# Patient Record
Sex: Male | Born: 2000 | Race: Black or African American | Hispanic: No | Marital: Single | Smoking: Never smoker
Health system: Southern US, Community
[De-identification: ages and names within clinical notes are randomized; demographics above are authoritative.]

## PROBLEM LIST (undated history)

## (undated) HISTORY — PX: TYMPANOSTOMY TUBE PLACEMENT: SHX32

## (undated) HISTORY — PX: APPENDECTOMY: SHX54

---
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
## 2022-11-07 ENCOUNTER — Other Ambulatory Visit: Payer: Self-pay

## 2022-11-07 ENCOUNTER — Emergency Department (HOSPITAL_COMMUNITY)
Admission: EM | Admit: 2022-11-07 | Discharge: 2022-11-07 | Disposition: A | Discharge status: Home / Self Care | Payer: Medicaid Other | Attending: Emergency Medicine | Admitting: Emergency Medicine

## 2022-11-07 ENCOUNTER — Encounter (HOSPITAL_COMMUNITY): Payer: Self-pay

## 2022-11-07 DIAGNOSIS — S01311A Laceration without foreign body of right ear, initial encounter: Secondary | ICD-10-CM | POA: Diagnosis present

## 2022-11-07 MED ORDER — IBUPROFEN 600 MG PO TABS: 600.0000 mg | ORAL_TABLET | Freq: Four times a day (QID) | ORAL | 0 refills | Status: DC | PRN

## 2022-11-07 MED ORDER — ACETAMINOPHEN 325 MG PO TABS: 650.0000 mg | ORAL_TABLET | Freq: Four times a day (QID) | ORAL | 0 refills | Status: DC | PRN

## 2022-11-07 MED ORDER — CYCLOBENZAPRINE HCL 10 MG PO TABS: 10.0000 mg | ORAL_TABLET | Freq: Two times a day (BID) | ORAL | 0 refills | Status: DC | PRN
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
## 2023-01-18 ENCOUNTER — Emergency Department (HOSPITAL_COMMUNITY)
Admission: EM | Admit: 2023-01-18 | Discharge: 2023-01-18 | Disposition: A | Discharge status: Home / Self Care | Payer: Medicaid Other | Attending: Emergency Medicine | Admitting: Emergency Medicine

## 2023-01-18 DIAGNOSIS — S0993XA Unspecified injury of face, initial encounter: Secondary | ICD-10-CM

## 2023-01-18 DIAGNOSIS — Y92410 Unspecified street and highway as the place of occurrence of the external cause: Secondary | ICD-10-CM | POA: Insufficient documentation

## 2023-01-18 MED ORDER — IBUPROFEN 600 MG PO TABS: 600.0000 mg | ORAL_TABLET | Freq: Four times a day (QID) | ORAL | 0 refills | Status: DC
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
## 2023-01-18 NOTE — Discharge Instructions (Signed)
Apply ice to the affected area for the next 24 hours.  Thereafter he can alternate between ice and heat therapy. Take Motrin every 6 hours for the next 3 days. Return to the ER if you start having difficulty with vision, increasing swelling of the face.
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
## 2023-08-05 ENCOUNTER — Encounter (HOSPITAL_COMMUNITY): Payer: Self-pay

## 2023-08-05 ENCOUNTER — Other Ambulatory Visit: Payer: Self-pay

## 2023-08-05 ENCOUNTER — Emergency Department (HOSPITAL_COMMUNITY)
Admission: EM | Admit: 2023-08-05 | Discharge: 2023-08-05 | Discharge status: Against Medical Advice | Attending: Emergency Medicine | Admitting: Emergency Medicine

## 2023-08-05 DIAGNOSIS — R109 Unspecified abdominal pain: Secondary | ICD-10-CM | POA: Diagnosis not present

## 2023-08-05 DIAGNOSIS — R42 Dizziness and giddiness: Secondary | ICD-10-CM | POA: Insufficient documentation

## 2023-08-05 DIAGNOSIS — Z5321 Procedure and treatment not carried out due to patient leaving prior to being seen by health care provider: Secondary | ICD-10-CM | POA: Diagnosis not present

## 2023-08-05 DIAGNOSIS — R112 Nausea with vomiting, unspecified: Secondary | ICD-10-CM | POA: Diagnosis present

## 2023-08-05 DIAGNOSIS — R519 Headache, unspecified: Secondary | ICD-10-CM | POA: Insufficient documentation

## 2023-08-05 LAB — URINALYSIS, ROUTINE W REFLEX MICROSCOPIC
Bilirubin Urine: NEGATIVE
Glucose, UA: NEGATIVE mg/dL
Hgb urine dipstick: NEGATIVE
Ketones, ur: NEGATIVE mg/dL
Leukocytes,Ua: NEGATIVE
Nitrite: NEGATIVE
Protein, ur: NEGATIVE mg/dL
Specific Gravity, Urine: 1.026 (ref 1.005–1.030)
pH: 6 (ref 5.0–8.0)

## 2023-08-05 LAB — CBC
HCT: 43.8 % (ref 39.0–52.0)
Hemoglobin: 15 g/dL (ref 13.0–17.0)
MCH: 32.4 pg (ref 26.0–34.0)
MCHC: 34.2 g/dL (ref 30.0–36.0)
MCV: 94.6 fL (ref 80.0–100.0)
Platelets: 229 K/uL (ref 150–400)
RBC: 4.63 MIL/uL (ref 4.22–5.81)
RDW: 12.6 % (ref 11.5–15.5)
WBC: 7.5 K/uL (ref 4.0–10.5)
nRBC: 0 % (ref 0.0–0.2)

## 2023-08-05 LAB — COMPREHENSIVE METABOLIC PANEL WITH GFR
ALT: 27 U/L (ref 0–44)
AST: 28 U/L (ref 15–41)
Albumin: 4.1 g/dL (ref 3.5–5.0)
Alkaline Phosphatase: 49 U/L (ref 38–126)
Anion gap: 12 (ref 5–15)
BUN: 15 mg/dL (ref 6–20)
CO2: 22 mmol/L (ref 22–32)
Calcium: 9 mg/dL (ref 8.9–10.3)
Chloride: 102 mmol/L (ref 98–111)
Creatinine, Ser: 1.05 mg/dL (ref 0.61–1.24)
GFR, Estimated: >60 mL/min (ref >60)
Glucose, Bld: 98 mg/dL (ref 70–99)
Potassium: 3.6 mmol/L (ref 3.5–5.1)
Sodium: 136 mmol/L (ref 135–145)
Total Bilirubin: 1.1 mg/dL (ref 0.0–1.2)
Total Protein: 7.2 g/dL (ref 6.5–8.1)

## 2023-08-05 LAB — LIPASE, BLOOD: Lipase: 25 U/L (ref 11–51)

## 2023-08-05 MED ORDER — ONDANSETRON 4 MG PO TBDP
4.0000 mg | ORAL_TABLET | Freq: Once | ORAL | Status: AC | PRN
  Administered 2023-08-05: 4 mg via ORAL
  Filled 2023-08-05: qty 1

## 2023-08-05 NOTE — ED Notes (Signed)
 Pt did not want to be seen stated he feels better ride is here to get him

## 2023-08-05 NOTE — ED Triage Notes (Signed)
 Pt states he woke up a few hours ago w/a headache, feeling lightheaded, nausea, vomiting, and abdominal pain.

## 2023-09-25 ENCOUNTER — Ambulatory Visit (INDEPENDENT_AMBULATORY_CARE_PROVIDER_SITE_OTHER): Admitting: Urology

## 2023-09-25 VITALS — BP 122/81 | HR 79 | Wt 160.0 lb

## 2023-09-25 DIAGNOSIS — R3912 Poor urinary stream: Secondary | ICD-10-CM | POA: Diagnosis not present

## 2023-09-25 LAB — BLADDER SCAN AMB NON-IMAGING

## 2023-09-25 MED ORDER — CELECOXIB 200 MG PO CAPS
200.0000 mg | ORAL_CAPSULE | Freq: Two times a day (BID) | ORAL | 0 refills | Status: AC
Start: 2023-09-25 — End: ?

## 2023-09-25 NOTE — Progress Notes (Signed)
   09/25/23 8:49 AM   Tristan Stevenson 21-15-2002 [id number]  CC: Weak urinary stream, sensation of incomplete emptying  HPI: Healthy 23 year old male who reports at least 5 years of urinary symptoms with weak stream and a sensation of incomplete emptying.  He feels like things may be better when he drinks primarily water during the day.  He denies any UTIs, dysuria, or gross hematuria, no pelvic pain.  No prior catheterizations or urologic procedures. PVR today is normal at 6ml  Recent urinalysis benign, STD testing normal, PSA normal at 0.92, hemoglobin A1c normal at 5.5, normal renal function eGFR greater than 60.  Surgical History: Past Surgical History:  Procedure Laterality Date   APPENDECTOMY     KNEE ARTHROSCOPY WITH LATERAL MENISECTOMY Left 03/26/2017   Procedure: KNEE ARTHROSCOPY WITH PARTIAL LATERAL MENISECTOMY;  Surgeon: Shari Sieving, MD;  Location: [facility];  Service: Orthopedics;  Laterality: Left;   LAPAROSCOPIC APPENDECTOMY N/A 09/12/2021   Procedure: APPENDECTOMY LAPAROSCOPIC;  Surgeon: Ebbie Cough, MD;  Location: [facility] OR;  Service: General;  Laterality: N/A;   TYMPANOSTOMY TUBE PLACEMENT Left    Social History:  reports that he has never smoked. He has never used smokeless tobacco. He reports that he does not drink alcohol and does not use drugs.  Physical Exam: BP 122/81 (BP Location: Left Arm, Patient Position: Sitting, Cuff Size: Normal)   Pulse 79   Wt 160 lb (72.6 kg)   SpO2 99%   BMI 20.54 kg/m    Constitutional:  Alert and oriented, No acute distress. Cardiovascular: No clubbing, cyanosis, or edema. Respiratory: Normal respiratory effort, no increased work of breathing. GI: Abdomen is soft, nontender, nondistended, no abdominal masses GU: Circumcised phallus, patent meatus, no lesions  Laboratory Data: Reviewed, see HPI  Assessment & Plan:   23 year old healthy male with at least 5 years of urinary symptoms with weak stream and  sensation of incomplete emptying.  These do seem to be exacerbated by bladder irritants and improved with water intake.  We reviewed possible etiologies including pelvic floor dysfunction or urethral stricture.  He would like to avoid cystoscopy if possible.    Trial of pelvic floor stretching exercises and Celebrex  RTC for cystoscopy 6 weeks, can cancel if symptoms have resolved  Redell Burnet, MD 09/25/2023  [facility] [address], [address] [place], [place] [address] [phone]

## 2023-09-25 NOTE — Patient Instructions (Signed)
 Pelvic Floor Dysfunction, Male     Pelvic floor dysfunction (PFD) is a condition that results when the group of muscles and connective tissues that support the organs in the pelvis (pelvic floor muscles) do not work well. These muscles and their connections form a sling that supports the colon and bladder. In men, these muscles also support the prostate gland. PFD causes pelvic floor muscles to be too weak, too tight, or both. In PFD, muscle movements are not coordinated. This may cause bowel or bladder problems. It may also cause pain. What are the causes? This condition may be caused by an injury to the pelvic area or by a weakening of pelvic muscles. In many cases, the exact cause is not known. What increases the risk? The following factors may make you more likely to develop PFD: Having chronic bladder tissue inflammation (interstitial cystitis). Being an older person. Being overweight. History of radiation treatment for cancer in the pelvic region. Previous pelvic surgery, such as removal of the prostate gland (prostatectomy). What are the signs or symptoms? Symptoms of this condition vary and may include: Bladder symptoms, such as: Trouble starting urination and emptying the bladder. Frequent urinary tract infections. Leaking urine when coughing, laughing, or exercising (stress incontinence). Having to pass urine urgently or frequently. Pain when passing urine. Bowel symptoms, such as: Constipation. Urgent or frequent bowel movements. Incomplete bowel movements. Painful bowel movements. Leaking stool or gas. Unexplained genital or rectal pain. Genital or rectal muscle spasms. Low back pain. Sexual dysfunction, such as erectile dysfunction, premature ejaculation, or pain during or after sexual activity. How is this diagnosed? This condition is diagnosed based on: Your symptoms and medical history. A physical exam. During the exam, your health care provider may check  your pelvic muscles for tightness, spasm, pain, or weakness. This may include a rectal exam. In some cases, you may have diagnostic tests, such as: Electrical muscle function tests. Urine flow testing. X-ray tests of bowel function. Ultrasound of the pelvic organs. How is this treated? Treatment for this condition depends on your symptoms. Treatment options include: Physical therapy. This may include Kegel exercises to help relax or strengthen the pelvic floor muscles. Biofeedback. This type of therapy provides feedback on how tight your pelvic floor muscles are so that you can learn to control them. Massage therapy. A treatment that involves electrical stimulation of the pelvic floor muscles to help control pain (transcutaneous electrical nerve stimulation, or TENS). Sound wave therapy (ultrasound) to reduce muscle spasms. Medicines, such as: Muscle relaxants. Bladder control medicines. Surgery to reconstruct or support pelvic floor muscles may be an option if other treatments do not help. Follow these instructions at home: Activity Do your usual activities as told by your health care provider. Ask your health care provider if you should modify any activities. Do pelvic floor strengthening or relaxing exercises at home as told by your physical therapist. Lifestyle Maintain a healthy weight. Eat foods that are high in fiber, such as beans, whole grains, and fresh fruits and vegetables. Limit foods that are high in fat and processed sugars, such as fried or sweet foods. Manage stress with relaxation techniques such as yoga or meditation. General instructions If you have problems with leakage: Use absorbable pads or wear padded underwear. Wash your genital and anal area frequently with mild soap. Keep your genital and anal area as clean and dry as possible. Ask your health care provider if you should try a barrier cream to prevent skin irritation. Take  warm baths to relieve pelvic  muscle tension or spasms. Take over-the-counter and prescription medicines only as told by your health care provider. Keep all follow-up visits. How is this prevented? The cause of PFD is not always known, but there are a few things you can do to reduce the risk of developing this condition, including: Staying at a healthy weight. Getting regular exercise. Managing stress. Contact a health care provider if: Your symptoms are not improving with home care. You have signs or symptoms of PFD that get worse. You develop new signs or symptoms. You have signs of a urinary tract infection, such as: Fever. Chills. Increased urinary frequency. A burning feeling when urinating. You have not had a bowel movement in 3 days (constipation). Summary Pelvic floor dysfunction results when the muscles and connective tissues in your pelvic floor do not work well. These muscles and their connections form a sling that supports your colon and bladder. In men, these muscles also support the prostate gland. PFD may be caused by an injury to the pelvic area or by a weakening of pelvic muscles. PFD causes pelvic floor muscles to be too weak, too tight, or a combination of both. Symptoms may vary from person to person. In most cases, PFD can be treated with physical therapies and medicines. Surgery may be an option if other treatments do not help.  Cystoscopy Cystoscopy is a procedure that is used to help diagnose and sometimes treat conditions that affect the lower urinary tract. The lower urinary tract includes the bladder and the urethra. The urethra is the tube that drains urine from the bladder. Cystoscopy is done using a thin, tube-shaped instrument with a light and camera at the end (cystoscope). The cystoscope may be hard or flexible, depending on the goal of the procedure. The cystoscope is inserted through the urethra, into the bladder. Cystoscopy may be recommended if you have: Urinary tract infections  that keep coming back. Blood in the urine (hematuria). An inability to control when you urinate (urinary incontinence) or an overactive bladder. Unusual cells found in a urine sample. A blockage in the urethra, such as a urinary stone. Painful urination. An abnormality in the bladder found during an intravenous pyelogram (IVP) or CT scan. What are the risks? Generally, this is a safe procedure. However, problems may occur, including: Infection. Bleeding.  What happens during the procedure?  You will be given one or more of the following: A medicine to numb the area (local anesthetic). The area around the opening of your urethra will be cleaned. The cystoscope will be passed through your urethra into your bladder. Germ-free (sterile) fluid will flow through the cystoscope to fill your bladder. The fluid will stretch your bladder so that your health care provider can clearly examine your bladder walls. Your doctor will look at the urethra and bladder. The cystoscope will be removed The procedure may vary among health care providers  What can I expect after the procedure? After the procedure, it is common to have: Some soreness or pain in your urethra. Urinary symptoms. These include: Mild pain or burning when you urinate. Pain should stop within a few minutes after you urinate. This may last for up to a few days after the procedure. A small amount of blood in your urine for several days. Feeling like you need to urinate but producing only a small amount of urine. Follow these instructions at home: General instructions Return to your normal activities as told by your health care provider.  Drink plenty of fluids after the procedure. Keep all follow-up visits as told by your health care provider. This is important. Contact a health care provider if you: Have pain that gets worse or does not get better with medicine, especially pain when you urinate lasting longer than 72 hours after  the procedure. Have trouble urinating. Get help right away if you: Have blood clots in your urine. Have a fever or chills. Are unable to urinate. Summary Cystoscopy is a procedure that is used to help diagnose and sometimes treat conditions that affect the lower urinary tract. Cystoscopy is done using a thin, tube-shaped instrument with a light and camera at the end. After the procedure, it is common to have some soreness or pain in your urethra. It is normal to have blood in your urine after the procedure.  If you were prescribed an antibiotic medicine, take it as told by your health care provider.  This information is not intended to replace advice given to you by your health care provider. Make sure you discuss any questions you have with your health care provider. Document Revised: 01/06/2018 Document Reviewed: 01/06/2018 Elsevier Patient Education  2020 [employer].

## 2023-11-06 ENCOUNTER — Other Ambulatory Visit: Admitting: Urology

## 2024-01-14 ENCOUNTER — Emergency Department (HOSPITAL_COMMUNITY)

## 2024-01-14 ENCOUNTER — Other Ambulatory Visit: Payer: Self-pay

## 2024-01-14 ENCOUNTER — Emergency Department (HOSPITAL_COMMUNITY)
Admission: EM | Admit: 2024-01-14 | Discharge: 2024-01-14 | Disposition: A | Discharge status: Home / Self Care | Attending: Emergency Medicine | Admitting: Emergency Medicine

## 2024-01-14 ENCOUNTER — Encounter (HOSPITAL_COMMUNITY): Payer: Self-pay

## 2024-01-14 DIAGNOSIS — R509 Fever, unspecified: Secondary | ICD-10-CM | POA: Diagnosis present

## 2024-01-14 DIAGNOSIS — E860 Dehydration: Secondary | ICD-10-CM | POA: Diagnosis not present

## 2024-01-14 DIAGNOSIS — M545 Low back pain, unspecified: Secondary | ICD-10-CM | POA: Diagnosis not present

## 2024-01-14 DIAGNOSIS — R1110 Vomiting, unspecified: Secondary | ICD-10-CM

## 2024-01-14 DIAGNOSIS — J101 Influenza due to other identified influenza virus with other respiratory manifestations: Secondary | ICD-10-CM | POA: Diagnosis not present

## 2024-01-14 LAB — COMPREHENSIVE METABOLIC PANEL WITH GFR
ALT: 40 U/L (ref 0–44)
AST: 48 U/L — ABNORMAL HIGH (ref 15–41)
Albumin: 4.5 g/dL (ref 3.5–5.0)
Alkaline Phosphatase: 70 U/L (ref 38–126)
Anion gap: 12 (ref 5–15)
BUN: 13 mg/dL (ref 6–20)
CO2: 23 mmol/L (ref 22–32)
Calcium: 9.3 mg/dL (ref 8.9–10.3)
Chloride: 106 mmol/L (ref 98–111)
Creatinine, Ser: 1.04 mg/dL (ref 0.61–1.24)
GFR, Estimated: >60 mL/min (ref >60)
Glucose, Bld: 104 mg/dL — ABNORMAL HIGH (ref 70–99)
Potassium: 4 mmol/L (ref 3.5–5.1)
Sodium: 141 mmol/L (ref 135–145)
Total Bilirubin: 1.3 mg/dL — ABNORMAL HIGH (ref 0.0–1.2)
Total Protein: 7.1 g/dL (ref 6.5–8.1)

## 2024-01-14 LAB — CBC WITH DIFFERENTIAL/PLATELET
Abs Immature Granulocytes: 0.01 K/uL (ref 0.00–0.07)
Basophils Absolute: 0 K/uL (ref 0.0–0.1)
Basophils Relative: 0 %
Eosinophils Absolute: 0 K/uL (ref 0.0–0.5)
Eosinophils Relative: 0 %
HCT: 39.2 % (ref 39.0–52.0)
Hemoglobin: 13.4 g/dL (ref 13.0–17.0)
Immature Granulocytes: 0 %
Lymphocytes Relative: 5 %
Lymphs Abs: 0.4 K/uL — ABNORMAL LOW (ref 0.7–4.0)
MCH: 32.1 pg (ref 26.0–34.0)
MCHC: 34.2 g/dL (ref 30.0–36.0)
MCV: 93.8 fL (ref 80.0–100.0)
Monocytes Absolute: 0.7 K/uL (ref 0.1–1.0)
Monocytes Relative: 8 %
Neutro Abs: 7 K/uL (ref 1.7–7.7)
Neutrophils Relative %: 87 %
Platelets: 195 K/uL (ref 150–400)
RBC: 4.18 MIL/uL — ABNORMAL LOW (ref 4.22–5.81)
RDW: 12.9 % (ref 11.5–15.5)
WBC: 8.2 K/uL (ref 4.0–10.5)
nRBC: 0 % (ref 0.0–0.2)

## 2024-01-14 LAB — URINALYSIS, ROUTINE W REFLEX MICROSCOPIC
Bilirubin Urine: NEGATIVE
Glucose, UA: NEGATIVE mg/dL
Hgb urine dipstick: NEGATIVE
Ketones, ur: NEGATIVE mg/dL
Leukocytes,Ua: NEGATIVE
Nitrite: NEGATIVE
Protein, ur: NEGATIVE mg/dL
Specific Gravity, Urine: 1.025 (ref 1.005–1.030)
pH: 6.5 (ref 5.0–8.0)

## 2024-01-14 LAB — GROUP A STREP BY PCR: Group A Strep by PCR: NOT DETECTED

## 2024-01-14 LAB — RESP PANEL BY RT-PCR (RSV, FLU A&B, COVID)  RVPGX2
Influenza A by PCR: POSITIVE — AB
Influenza B by PCR: NEGATIVE
Resp Syncytial Virus by PCR: NEGATIVE
SARS Coronavirus 2 by RT PCR: NEGATIVE

## 2024-01-14 LAB — LIPASE, BLOOD: Lipase: 13 U/L (ref 11–51)

## 2024-01-14 MED ORDER — ONDANSETRON 4 MG PO TBDP: ORAL_TABLET | ORAL | 0 refills | Status: AC

## 2024-01-14 MED ORDER — ONDANSETRON 4 MG PO TBDP
4.0000 mg | ORAL_TABLET | Freq: Once | ORAL | Status: AC
  Administered 2024-01-14: 10:00:00 4 mg via ORAL
  Filled 2024-01-14: qty 1

## 2024-01-14 NOTE — ED Triage Notes (Signed)
 Patient complains of sore throat with nausea and vomiting x 1 day, denies fever. NAD

## 2024-01-14 NOTE — ED Notes (Signed)
 Discharge papers discussed with pt caregiver. Discussed s/sx to return, follow up with PCP, medications given/next dose due. Caregiver verbalized understanding.  ?

## 2024-01-14 NOTE — ED Provider Notes (Signed)
EMERGENCY DEPARTMENT AT [facility] Provider Note   CSN: [id number] Arrival date & time: 01/14/24  9143     Patient presents with: Nausea (Pt complaining of nausea, body aches, and chills x 1 day)   Tristan Stevenson is a 23 y.o. male.   Patient presents with sore throat, cough congestion breathing difficulty nausea and vomiting for 2 days.  Patient had subjective fevers.  No chest pain abdominal pain or passing out.  Patient has lower back pain.  No history of kidney stones.  No recent sick contact exposure or travel.  The  The history is provided by the patient.       Prior to Admission medications  Medication Sig Start Date End Date Taking? Authorizing Provider  ondansetron  (ZOFRAN -ODT) 4 MG disintegrating tablet 4mg  ODT q6 hours prn nausea/vomit 01/14/24  Yes Nicholi Ghuman, MD  acetaminophen  (TYLENOL ) 325 MG tablet Take 2 tablets (650 mg total) by mouth every 6 (six) hours as needed. 11/07/22   Elnor Jayson LABOR, DO  bismuth subsalicylate (PEPTO BISMOL) 262 MG chewable tablet Chew 786 mg by mouth daily as needed for diarrhea or loose stools.    [provider]  celecoxib  (CELEBREX ) 200 MG capsule Take 1 capsule (200 mg total) by mouth 2 (two) times daily. 09/25/23   Francisca Redell BROCKS, MD  cyclobenzaprine  (FLEXERIL ) 10 MG tablet Take 1 tablet (10 mg total) by mouth 2 (two) times daily as needed for muscle spasms. 11/07/22   Elnor Jayson LABOR, DO  ibuprofen  (ADVIL ) 600 MG tablet Take 1 tablet (600 mg total) by mouth 4 (four) times daily. 01/18/23   Charlyn Sora, MD  traMADol  (ULTRAM ) 50 MG tablet Take 2 tablets (100 mg total) by mouth every 6 (six) hours as needed. 09/12/21   Ebbie Cough, MD    Allergies: Patient has no known allergies.    Review of Systems  Constitutional:  Positive for appetite change and fever. Negative for chills.  HENT:  Positive for congestion.   Eyes:  Negative for visual disturbance.  Respiratory:  Positive for cough and  shortness of breath.   Cardiovascular:  Negative for chest pain.  Gastrointestinal:  Positive for nausea. Negative for abdominal pain and vomiting.  Genitourinary:  Negative for dysuria and flank pain.  Musculoskeletal:  Positive for back pain. Negative for neck pain and neck stiffness.  Skin:  Negative for rash.  Neurological:  Negative for light-headedness and headaches.    Updated Vital Signs BP (!) 125/59 (BP Location: Right Arm)   Pulse 91   Temp (!) 97.4 F (36.3 C) (Oral)   Resp 18   Ht 6' 2 (1.88 m)   Wt 72 kg   SpO2 100%   BMI 20.38 kg/m   Physical Exam Vitals and nursing note reviewed.  Constitutional:      General: He is not in acute distress.    Appearance: He is well-developed.  HENT:     Head: Normocephalic and atraumatic.     Mouth/Throat:     Mouth: Mucous membranes are moist.  Eyes:     General:        Right eye: No discharge.        Left eye: No discharge.     Conjunctiva/sclera: Conjunctivae normal.  Neck:     Trachea: No tracheal deviation.  Cardiovascular:     Rate and Rhythm: Normal rate and regular rhythm.     Heart sounds: No murmur heard. Pulmonary:     Effort: Pulmonary effort  is normal.     Breath sounds: Normal breath sounds.  Abdominal:     General: There is no distension.     Palpations: Abdomen is soft.     Tenderness: There is no abdominal tenderness. There is no guarding.  Musculoskeletal:     Cervical back: Normal range of motion and neck supple. No rigidity.  Skin:    General: Skin is warm.     Capillary Refill: Capillary refill takes less than 2 seconds.     Findings: No rash.  Neurological:     General: No focal deficit present.     Mental Status: He is alert.     Cranial Nerves: No cranial nerve deficit.  Psychiatric:        Mood and Affect: Mood normal.     (all labs ordered are listed, but only abnormal results are displayed) Labs Reviewed  RESP PANEL BY RT-PCR (RSV, FLU A&B, COVID)  RVPGX2 - Abnormal; Notable  for the following components:      Result Value   Influenza A by PCR POSITIVE (*)    All other components within normal limits  CBC WITH DIFFERENTIAL/PLATELET - Abnormal; Notable for the following components:   RBC 4.18 (*)    Lymphs Abs 0.4 (*)    All other components within normal limits  COMPREHENSIVE METABOLIC PANEL WITH GFR - Abnormal; Notable for the following components:   Glucose, Bld 104 (*)    AST 48 (*)    Total Bilirubin 1.3 (*)    All other components within normal limits  GROUP A STREP BY PCR  LIPASE, BLOOD  URINALYSIS, ROUTINE W REFLEX MICROSCOPIC    EKG: None  Radiology: DG Chest 2 View Result Date: 01/14/2024 EXAM: 2 VIEW(S) XRAY OF THE CHEST 01/14/2024 10:00:00 AM COMPARISON: AP and lateral radiographs of the chest dated 04/06/2006. CLINICAL HISTORY: Cough/SHOB FINDINGS: LUNGS AND PLEURA: No focal pulmonary opacity. No pleural effusion. No pneumothorax. HEART AND MEDIASTINUM: No acute abnormality of the cardiac and mediastinal silhouettes. BONES AND SOFT TISSUES: Mild thoracolumbar levoscoliosis. No acute osseous abnormality. IMPRESSION: 1. No acute findings. 2. Mild thoracolumbar levoscoliosis. Electronically signed by: Evalene Coho MD 01/14/2024 10:10 AM EST RP Workstation: [id number]     Procedures   Medications Ordered in the ED  ondansetron  (ZOFRAN -ODT) disintegrating tablet 4 mg (4 mg Oral Given 01/14/24 1014)                                    Medical Decision Making Risk Prescription drug management.   Patient presents with multitude of symptoms most likely consistent with viral syndrome/influenza especially given season.  Patient does have sore throat strep test negative no evidence of peritonsillar abscess or deep space infection or meningitis.  Blood work independent reviewed overall reassuring electrolytes, kidney function, hemoglobin and white count within normal limits.  Urinalysis reviewed no signs of infection.  Strep test negative.   Viral test excepted.  Chest x-ray independent reviewed no infiltrate.  Patient stable for discharge outpatient follow-up.  Patient feels improved after Zofran .     Final diagnoses:  Flu-like symptoms  Dehydration  Vomiting in adult    ED Discharge Orders          Ordered    ondansetron  (ZOFRAN -ODT) 4 MG disintegrating tablet        01/14/24 1053               Tonia Chew, MD  01/14/24 1102 ° °

## 2024-01-14 NOTE — ED Provider Triage Note (Signed)
 Emergency Medicine Provider Triage Evaluation Note  Tristan Stevenson , a 23 y.o. male  was evaluated in triage.  Pt complains of sore throat, nausea, vomiting with associated shortness of breath x 2 days.  Patient reports yesterday with insidious onset sore throat and later that evening with associated nausea, vomiting, breath with inspiration.  Review of Systems  Positive: Sore throat, shortness of breath, nausea Negative: Chest pain, dizziness, syncope, fever, abdominal pain  Physical Exam  BP (!) 125/59 (BP Location: Right Arm)   Pulse 91   Temp (!) 97.4 F (36.3 C) (Oral)   Resp 18   SpO2 100%  Gen:   Awake, no distress   Resp:  Normal effort lungs are clear to auscultation in all fields MSK:   Moves extremities without difficulty, no bilateral extremity edema Other:  To light and deep palpation throughout the abdomen no pain to palpation. Tonsils are bilaterally edematous, no exudates.  Medical Decision Making  Medically screening exam initiated at 9:35 AM.  Appropriate orders placed.  Tristan Stevenson was informed that the remainder of the evaluation will be completed by another provider, this initial triage assessment does not replace that evaluation, and the importance of remaining in the ED until their evaluation is complete.  Patient denies any recent travels or recent sick exposures.  Patient has a benign abdominal exam and patient has had appendectomy last year.  Negative Murphy sign.   Tristan Stevenson, [place] 01/14/24 [phone]

## 2024-01-14 NOTE — Discharge Instructions (Addendum)
 Your flu test was positive. Use Zofran  every 6 hours needed for nausea and vomiting. Use Tylenol  Motrin  as needed for headache pain or fever. Your blood work was reassuring and your chest x-ray did not show signs of pneumonia. Return for new concerns.

## 2024-01-16 ENCOUNTER — Emergency Department
Admission: EM | Admit: 2024-01-16 | Discharge: 2024-01-16 | Disposition: A | Discharge status: Home / Self Care | Source: Home / Self Care | Attending: Emergency Medicine | Admitting: Emergency Medicine

## 2024-01-16 ENCOUNTER — Other Ambulatory Visit: Payer: Self-pay

## 2024-01-16 ENCOUNTER — Encounter: Payer: Self-pay | Admitting: Medical Oncology

## 2024-01-16 DIAGNOSIS — H1133 Conjunctival hemorrhage, bilateral: Secondary | ICD-10-CM | POA: Diagnosis not present

## 2024-01-16 DIAGNOSIS — G43009 Migraine without aura, not intractable, without status migrainosus: Secondary | ICD-10-CM | POA: Insufficient documentation

## 2024-01-16 MED ORDER — SODIUM CHLORIDE 0.9 % IV BOLUS
1000.0000 mL | Freq: Once | INTRAVENOUS | Status: AC
  Administered 2024-01-16: 1000 mL via INTRAVENOUS

## 2024-01-16 MED ORDER — FLUORESCEIN SODIUM 1 MG OP STRP
1.0000 | ORAL_STRIP | Freq: Once | OPHTHALMIC | Status: AC
  Administered 2024-01-16: 1 via OPHTHALMIC
  Filled 2024-01-16: qty 1

## 2024-01-16 MED ORDER — DEXAMETHASONE SOD PHOSPHATE PF 10 MG/ML IJ SOLN
8.0000 mg | Freq: Once | INTRAMUSCULAR | Status: AC
  Administered 2024-01-16: 8 mg via INTRAVENOUS

## 2024-01-16 MED ORDER — TETRACAINE HCL 0.5 % OP SOLN
1.0000 [drp] | Freq: Once | OPHTHALMIC | Status: AC
  Administered 2024-01-16: 1 [drp] via OPHTHALMIC
  Filled 2024-01-16: qty 4

## 2024-01-16 MED ORDER — DIPHENHYDRAMINE HCL 50 MG/ML IJ SOLN
25.0000 mg | Freq: Once | INTRAMUSCULAR | Status: AC
  Administered 2024-01-16: 25 mg via INTRAVENOUS
  Filled 2024-01-16: qty 1

## 2024-01-16 MED ORDER — METOCLOPRAMIDE HCL 5 MG/ML IJ SOLN
10.0000 mg | Freq: Once | INTRAMUSCULAR | Status: AC
  Administered 2024-01-16: 10 mg via INTRAVENOUS
  Filled 2024-01-16: qty 2

## 2024-01-16 NOTE — ED Triage Notes (Signed)
 Patient c/o burst blood vessel to bilateral eyes.

## 2024-01-16 NOTE — ED Triage Notes (Signed)
 Pt reports being diagnosed with the flu 2 days ago and yesterday he vomited and then blit eyes had bursted blood vessels. Pt reports headache with hx of migraine.

## 2024-01-16 NOTE — ED Provider Notes (Signed)
 "  [facility] Provider Note    Event Date/Time   First MD Initiated Contact with Patient 01/16/24 1029     (approximate)   History   Eye Problem and Influenza   HPI  Tristan Stevenson is a 23 y.o. male  with a past medical history of migraines presents to the emergency department with reports of burst blood vessels to his bilateral eyes and unilateral headache surrounding his left eye and frontal portion of his head that started 2 days ago after being diagnosed with influenza by his primary care provider.  He also endorses photophobia.  He denies nausea, vomiting, fall or injury, loss of consciousness, dizziness or lightheadedness, neck pain, and changes.  He reports he has been taking his migraine medication at home for the past 3 days and it is not working.  He reports this pain does feel like one of his regular migraines.  He is not diabetic.  He reports grittiness in his eyes and is unsure if he has scratched it.  He does not wear contacts.  He reports he takes migraine medication that starts with an E but is unsure of the name; it is not listed in his chart.  Physical Exam   Triage Vital Signs: ED Triage Vitals  Encounter Vitals Group     BP 01/16/24 1006 136/80     Girls Systolic BP Percentile --      Girls Diastolic BP Percentile --      Boys Systolic BP Percentile --      Boys Diastolic BP Percentile --      Pulse Rate 01/16/24 1006 88     Resp 01/16/24 1006 17     Temp 01/16/24 1006 98.6 F (37 C)     Temp Source 01/16/24 1006 Oral     SpO2 01/16/24 1006 100 %     Weight 01/16/24 1009 158 lb 11.7 oz (72 kg)     Height 01/16/24 1009 6' 2 (1.88 m)     Head Circumference --      Peak Flow --      Pain Score 01/16/24 1005 6     Pain Loc --      Pain Education --      Exclude from Growth Chart --     Most recent vital signs: Vitals:   01/16/24 1006  BP: 136/80  Pulse: 88  Resp: 17  Temp: 98.6 F (37 C)  SpO2: 100%    General: Awake, in  no acute distress. Appears stated age. Head: Normocephalic, atraumatic. Eyes: PERRLA. EOMs intact. B/l eyes with conjunctival injection. Ears/Nose/Throat: Nares patent, no nasal discharge. Oropharynx moist, no erythema or exudate. Dentition intact. Neck: Supple, no lymphadenopathy, no nuchal rigidity. CV: Good peripheral perfusion. RRR, 88 bpm. Respiratory:Normal respiratory effort.  No respiratory distress.  GI: Soft, non-distended. Skin:Warm, dry, intact. No rashes, lesions, or ecchymosis. No cyanosis or pallor. Neurological: A&Ox4 to person, place, time, and situation. Cranial nerves II-XII grossly intact. Sensation intact. Strength symmetric. No focal deficits.   ED Results / Procedures / Treatments   Labs (all labs ordered are listed, but only abnormal results are displayed) Labs Reviewed - No data to display   EKG     RADIOLOGY    PROCEDURES:  Critical Care performed: No   Procedures   MEDICATIONS ORDERED IN ED: Medications  tetracaine  (PONTOCAINE) 0.5 % ophthalmic solution 1 drop (1 drop Left Eye Given by Other 01/16/24 1139)  fluorescein  ophthalmic strip 1 strip (1 strip  Left Eye Given 01/16/24 1140)  sodium chloride  0.9 % bolus 1,000 mL (1,000 mLs Intravenous New Bag/Given 01/16/24 1139)  diphenhydrAMINE (BENADRYL) injection 25 mg (25 mg Intravenous Given 01/16/24 1140)  dexamethasone  (DECADRON ) injection 8 mg (8 mg Intravenous Given 01/16/24 1140)  metoCLOPramide (REGLAN) injection 10 mg (10 mg Intravenous Given 01/16/24 1140)     IMPRESSION / MDM / ASSESSMENT AND PLAN / ED COURSE  I reviewed the triage vital signs and the nursing notes.                              Differential diagnosis includes, but is not limited to, subconjunctival hemorrhage, hyphema, migraine, cluster headache, tension headache  Patient's presentation is most consistent with acute complicated illness / injury requiring diagnostic workup.  Patient is a 23 year old male presenting  with signs and symptoms as described above.  He is afebrile, has no vision changes and has a gritty sensation in his left eye but no pain in his right eye.  He also has a pain in the left frontal portion of his head with photophobia.  He reports this feels like one of his migraines.  He has bilateral subconjunctival hemorrhages present, no evidence of hyphema.  Trialed a migraine cocktail and did check for corneal abrasion or ulceration with Woods lamp which did not reveal evidence of either.  Patient reports he is feeling better after the migraine cocktail and is ready to go home.  He no longer has any pain in his eye or head.  I told him to follow-up with his primary care provider following today's visit and to take his prescribed migraine medication at home as well as Tylenol  and ibuprofen  over-the-counter.  His mother will come pick him up.  The patient may return to the emergency department for any vision changes, fever, worsening or new headache, lightheadedness or dizziness, or any new, worsening, or concerning symptoms. Patient was given the opportunity to ask questions; all questions were answered. Emergency department return precautions were discussed with the patient.  Patient is in agreement to the treatment plan.  Patient is stable for discharge.    FINAL CLINICAL IMPRESSION(S) / ED DIAGNOSES   Final diagnoses:  Migraine without aura and without status migrainosus, not intractable  Subconjunctival hematoma, bilateral     Rx / DC Orders   ED Discharge Orders     None        Note:  This document was prepared using Dragon voice recognition software and may include unintentional dictation errors.     Sheron Salm, PA-C 01/16/24 1302    Jacolyn Pae, MD 01/16/24 1558  "

## 2024-01-16 NOTE — Discharge Instructions (Addendum)
 You have been seen in the Emergency Department (ED) for a migraine.  Please use Tylenol  or Motrin  as needed for symptoms, but only as written on the box, and take any regular medications that you regularly take for migraines. As we have discussed, please follow up with your doctor as soon as possible regarding todays ED visit and your headache symptoms.    Call your doctor or return to the Emergency Department (ED) if you have a worsening headache, sudden and severe headache, confusion, slurred speech, facial droop, weakness or numbness in any arm or leg, extreme fatigue, or other symptoms that concern you.   The burst blood vessels in your eye will resolve with time.  Please pick up artificial tears that I sent a picture from below that can be picked up over-the-counter.  Please return the emergency department if you experience any worsening eye pain, fever, vision changes, recurrent headache, lightheadedness or dizziness, or any other new, worsening or concerning symptoms.

## 2024-01-20 ENCOUNTER — Emergency Department
Admission: EM | Admit: 2024-01-20 | Discharge: 2024-01-20 | Disposition: A | Discharge status: Home / Self Care | Attending: Emergency Medicine | Admitting: Emergency Medicine

## 2024-01-20 ENCOUNTER — Emergency Department

## 2024-01-20 ENCOUNTER — Other Ambulatory Visit: Payer: Self-pay

## 2024-01-20 DIAGNOSIS — R748 Abnormal levels of other serum enzymes: Secondary | ICD-10-CM

## 2024-01-20 DIAGNOSIS — R059 Cough, unspecified: Secondary | ICD-10-CM | POA: Diagnosis not present

## 2024-01-20 DIAGNOSIS — R7401 Elevation of levels of liver transaminase levels: Secondary | ICD-10-CM | POA: Insufficient documentation

## 2024-01-20 DIAGNOSIS — R17 Unspecified jaundice: Secondary | ICD-10-CM

## 2024-01-20 DIAGNOSIS — H5789 Other specified disorders of eye and adnexa: Secondary | ICD-10-CM | POA: Diagnosis not present

## 2024-01-20 DIAGNOSIS — R042 Hemoptysis: Secondary | ICD-10-CM | POA: Diagnosis present

## 2024-01-20 LAB — CBC
HCT: 42.2 % (ref 39.0–52.0)
Hemoglobin: 14.4 g/dL (ref 13.0–17.0)
MCH: 31.4 pg (ref 26.0–34.0)
MCHC: 34.1 g/dL (ref 30.0–36.0)
MCV: 92.1 fL (ref 80.0–100.0)
Platelets: 213 K/uL (ref 150–400)
RBC: 4.58 MIL/uL (ref 4.22–5.81)
RDW: 11.9 % (ref 11.5–15.5)
WBC: 6.1 K/uL (ref 4.0–10.5)
nRBC: 0 % (ref 0.0–0.2)

## 2024-01-20 LAB — COMPREHENSIVE METABOLIC PANEL WITH GFR
ALT: 52 U/L — ABNORMAL HIGH (ref 0–44)
AST: 56 U/L — ABNORMAL HIGH (ref 15–41)
Albumin: 4.4 g/dL (ref 3.5–5.0)
Alkaline Phosphatase: 62 U/L (ref 38–126)
Anion gap: 15 (ref 5–15)
BUN: 15 mg/dL (ref 6–20)
CO2: 23 mmol/L (ref 22–32)
Calcium: 9 mg/dL (ref 8.9–10.3)
Chloride: 103 mmol/L (ref 98–111)
Creatinine, Ser: 0.89 mg/dL (ref 0.61–1.24)
GFR, Estimated: >60 mL/min
Glucose, Bld: 76 mg/dL (ref 70–99)
Potassium: 4 mmol/L (ref 3.5–5.1)
Sodium: 141 mmol/L (ref 135–145)
Total Bilirubin: 1 mg/dL (ref 0.0–1.2)
Total Protein: 7.3 g/dL (ref 6.5–8.1)

## 2024-01-20 LAB — D-DIMER, QUANTITATIVE: D-Dimer, Quant: <0.27 ug{FEU}/mL (ref 0.00–0.50)

## 2024-01-20 LAB — LIPASE, BLOOD: Lipase: 21 U/L (ref 11–51)

## 2024-01-20 MED ORDER — GUAIFENESIN ER 600 MG PO TB12: 600.0000 mg | ORAL_TABLET | Freq: Two times a day (BID) | ORAL | 0 refills | Status: AC

## 2024-01-20 NOTE — ED Provider Notes (Signed)
 Presenting with a cough.  States that he had out some blood.  Denies any hemoptysis or hematemesis.  Denies any history of coagulopathy.  No bleeding anywhere else.  No fever.  He did have subconjunctival hemorrhage that was noted several days ago.  States that his eyes are starting to look a little bit yellow when the hemorrhage is improving.  Denies taking more than 3 to 4 g of Tylenol  a day.  He denies any unexpected weight loss, no history of liver issues.  No recent travel, no unilateral calf swelling or tenderness.  No chest pain or shortness of breath, denies additional symptoms.  On exam, he has mildly yellow conjunctiva with small areas of subconjunctival hemorrhage.  Pupils are equal, extraocular movements are intact.  Did ask him to lift his tongue, it does not appear jaundice, clear oropharynx.  No tachypnea respiratory distress, no unilateral calf swelling or tenderness.  Suspect the slightly yellow conjunctiva is from the resorbing subconjunctival hemorrhage.  The slight blood that he spit up could be from some throat irritation.  He has no other risk factors for PE.  Labs including D-dimer obtained out of triage and he was monitored in ED without recurrence of spitting up blood.  Labs are reassuring.  Did discuss with him about mildly elevated LFTs.  Suspect this could be from viral illness.  Will have him follow-up with primary care this week or early next week to get repeat labs and get reassessed.  Otherwise considered but no indication for inpatient admission at this time, he safe for outpatient management.  Will discharge with strict return precautions.   Waymond Lorelle Cummins, MD 01/20/24 [phone]

## 2024-01-20 NOTE — ED Triage Notes (Addendum)
 Pt comes with c/o coughing up blood. Pt states this started today. Pt went to spit in the sink and it was bloody. Pt dx with flu on the 19th. Pt states it was a lot of blood the first time.

## 2024-01-20 NOTE — ED Notes (Signed)
 See triage note  Presents with cough  States this started couple of days ago  But today noticed some blood with the cough His eyes are also red and slightly juandice

## 2024-01-20 NOTE — Discharge Instructions (Addendum)
 You were seen in the emergency department for 1 episode of spitting up blood and yellowing of your eyes.  This yellowing may be a result of the burst of blood vessels in your eyes healing.  Please follow-up with your primary care provider Judeth Minor) as soon as possible for repeated liver labs and for follow up of your hepatitis panel.  Please return to the emergency department immediately if you experience vomiting, abdominal pain, worsening yellowing of your eyes, more spitting or coughing up blood, chest pain, dyspnea or any other new, worsening or concerning symptoms. I sent a prescription for Mucinex  to [facility] to help with the mucous as well.

## 2024-01-20 NOTE — ED Provider Notes (Signed)
 "  [facility] Provider Note    Event Date/Time   First MD Initiated Contact with Patient 01/20/24 1427     (approximate)   History   Cough   HPI  Tristan Stevenson is a 23 y.o. male  with a past medical history of migraines presents to the emergency department with reports of spitting up blood that started today.  He reports he went to spit in the sink and had 1 small clot come out and had some streaks as well. He reports mucous in the back of his throat. He reports yellowing of his eyes as well, stating this is new.  He reports he does smoke marijuana and use vape pen, but has not done either in 1 week.  Denies cigarette or alcohol use.  He was recently diagnosed with the flu. Denies hemoptysis, headache, vision changes, chest or abdominal pain, dyspnea, nausea, vomiting, hematemesis, diarrhea, weight loss. He reports taking 3 total Tylenol  per day for roughly 1 week. No recent travel. No history of bleeding disorders. No recent surgeries or prior concerns with his liver.  I reviewed the medical chart. He was diagnosed with the flu on December 17 by his primary care provider.  I also saw this patient in the ER on December 19 as well for bilateral subconjunctival hemorrhages and migraine.    Physical Exam   Triage Vital Signs: ED Triage Vitals  Encounter Vitals Group     BP 01/20/24 1307 136/85     Girls Systolic BP Percentile --      Girls Diastolic BP Percentile --      Boys Systolic BP Percentile --      Boys Diastolic BP Percentile --      Pulse Rate 01/20/24 1307 80     Resp 01/20/24 1307 18     Temp 01/20/24 1307 98 F (36.7 C)     Temp src --      SpO2 01/20/24 1307 100 %     Weight 01/20/24 1253 160 lb (72.6 kg)     Height 01/20/24 1253 6' 2 (1.88 m)     Head Circumference --      Peak Flow --      Pain Score 01/20/24 1253 0     Pain Loc --      Pain Education --      Exclude from Growth Chart --     Most recent vital signs: Vitals:    01/20/24 1307  BP: 136/85  Pulse: 80  Resp: 18  Temp: 98 F (36.7 C)  SpO2: 100%    General: Awake, in no acute distress. Appears stated age. Head: Normocephalic, atraumatic. Eyes: PERRLA. Subconjunctival hemorrhages present b/l, resolving, but with b/l mild yellow discoloration. Ears/Nose/Throat: Nares patent, no nasal discharge. Oropharynx moist, no erythema or exudate. Dentition intact. Neck: Supple, no lymphadenopathy, no nuchal rigidity. CV: Good peripheral perfusion. No edema or swelling of lower extremities b/l. Respiratory:Normal respiratory effort.  No respiratory distress. CTAB. GI: Soft, non-distended, non-tender. No rebound or guarding. No hepatomegaly. Skin:Warm, dry, intact. No rashes, lesions, or ecchymosis. No cyanosis or pallor. Neurological: A&Ox4 to person, place, time, and situation.    ED Results / Procedures / Treatments   Labs (all labs ordered are listed, but only abnormal results are displayed) Labs Reviewed  COMPREHENSIVE METABOLIC PANEL WITH GFR - Abnormal; Notable for the following components:      Result Value   AST 56 (*)    ALT 52 (*)  All other components within normal limits  CBC  LIPASE, BLOOD  D-DIMER, QUANTITATIVE  HEPATITIS PANEL, ACUTE     EKG     RADIOLOGY CXR ordered.    PROCEDURES:  Critical Care performed: No  Procedures   MEDICATIONS ORDERED IN ED: Medications - No data to display   IMPRESSION / MDM / ASSESSMENT AND PLAN / ED COURSE  I reviewed the triage vital signs and the nursing notes.                              Differential diagnosis includes, but is not limited to, post-influenza bronchitis, PE, tuberculosis, healing subconjunctival hemorrhages, transaminitis, hepatitis  Patient's presentation is most consistent with acute complicated illness / injury requiring diagnostic workup.  Clinical Course as of 01/20/24 1915  Tue Jan 20, 2024  1722 Late entry.  Patient here for 1 episode of spitting up  blood, reports it was 1 blood clot.  His eyes also appear jaundiced compared to when I seen him here few days ago for a migraine and post flu symptoms.  He is not having any abdominal or chest pain, no nausea vomiting or diarrhea.  His vital signs are within normal range.  Will at least obtain some labs on him and a chest x-ray at this time. [SD]  1723 CBC unremarkable.  D-dimer unremarkable. [SD]  1723 DG Chest 2 View [SD]  1723 Chest x-ray IMPRESSION: No active cardiopulmonary disease.   [SD]  1736 I independently viewed the x-ray and radiologist's report.  I agree with the radiologist's report that there are no acute findings.  [SD]  1742 Lipase unremarkable. AST 56, ALT 52; mild elevations. [SD]  1901 Late entry. Discussed case w/ Dr. Tan, whom evaluated the patient as well. Patient does not have excessive Tylenol  or alcohol use, no recent marijuana use. He is well-appearing on exam, with normal VS, no abdominal tenderness or distention. No risk factors for DVT. Breathing normally on exam w/ normal lung sounds, no tachypnea or tachycardia. Believe the yellowing of his eyes could be from his subconjunctival hemorrhages healing. Considering 1 episode of spitting up blood as part of bronchial irritation after prior influenza diagnosed ~ 1 week ago. I did send a prescription for Mucinex  for him given he is having some mucous drainage / post nasal drip. Discussed elevated liver labs with him. We will draw hepatitis panel and have him follow up on these lab results with his PCP regarding results and for repeat liver enzyme testing.  [SD]    Clinical Course User Index [SD] Sheron Salm, PA-C   The patient may return to the emergency department for any new, worsening, or concerning symptoms. Patient was given the opportunity to ask questions; all questions were answered. Emergency department return precautions were discussed with the patient.  Patient is in agreement to the treatment plan.  Patient is  stable for discharge.   FINAL CLINICAL IMPRESSION(S) / ED DIAGNOSES   Final diagnoses:  Spitting up blood  Yellow eyes  Elevated liver enzymes     Rx / DC Orders   ED Discharge Orders          Ordered    guaiFENesin  (MUCINEX ) 600 MG 12 hr tablet  2 times daily        01/20/24 1806             Note:  This document was prepared using Dragon voice recognition software and may include  unintentional dictation errors.     Sheron Salm, PA-C 01/20/24 1915    Waymond Lorelle Cummins, MD 01/20/24 [phone]  "

## 2024-01-21 LAB — HEPATITIS PANEL, ACUTE
HCV Ab: NONREACTIVE
Hep A IgM: NONREACTIVE
Hep B C IgM: NONREACTIVE
Hepatitis B Surface Ag: NONREACTIVE
